# Patient Record
Sex: Male | Born: 1990 | Race: Black or African American | Hispanic: No | Marital: Single | Smoking: Never smoker
Health system: Southern US, Community
[De-identification: ages and names within clinical notes are randomized; demographics above are authoritative.]

## PROBLEM LIST (undated history)

## (undated) DIAGNOSIS — M24419 Recurrent dislocation, unspecified shoulder: Secondary | ICD-10-CM

## (undated) HISTORY — DX: Recurrent dislocation, unspecified shoulder: M24.419

---
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
## 2012-01-21 ENCOUNTER — Ambulatory Visit: Payer: Self-pay | Admitting: Family Medicine

## 2012-02-21 ENCOUNTER — Ambulatory Visit (INDEPENDENT_AMBULATORY_CARE_PROVIDER_SITE_OTHER): Payer: Medicaid Other | Admitting: Family Medicine

## 2012-02-21 ENCOUNTER — Encounter: Payer: Self-pay | Admitting: Family Medicine

## 2012-02-21 VITALS — BP 127/77 | HR 73 | Temp 98.7°F | Ht 68.0 in | Wt 174.0 lb

## 2012-02-21 DIAGNOSIS — M549 Dorsalgia, unspecified: Secondary | ICD-10-CM

## 2012-02-21 DIAGNOSIS — L910 Hypertrophic scar: Secondary | ICD-10-CM

## 2012-02-21 DIAGNOSIS — H61899 Other specified disorders of external ear, unspecified ear: Secondary | ICD-10-CM | POA: Insufficient documentation

## 2012-02-21 DIAGNOSIS — M24419 Recurrent dislocation, unspecified shoulder: Secondary | ICD-10-CM | POA: Insufficient documentation

## 2012-02-21 MED ORDER — CYCLOBENZAPRINE HCL 10 MG PO TABS: 5.0000 mg | ORAL_TABLET | Freq: Two times a day (BID) | ORAL | Status: AC | PRN

## 2012-02-21 NOTE — Patient Instructions (Signed)
Nice to meet you. 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 Do not get any more piercings or tattoos, which can make keloids worse. You can try flexeril or motrin/aleve when you have back pain. Use heating pads as needed. Make appointment if this gets worse or is happening frequently.  Back Pain, Adult Back pain is very common. The pain often gets better over time. The cause of back pain is usually not dangerous. Most people can learn to manage their back pain on their own.  HOME CARE   Stay active. Start with short walks on flat ground if you can. Try to walk farther each day.  Do not sit, drive, or stand in one place for more than 30 minutes. Do not stay in bed.  Do not avoid exercise or work. Activity can help your back heal faster.  Be careful when you bend or lift an object. Bend at your knees, keep the object close to you, and do not twist.  Sleep on a firm mattress. Lie on your side, and bend your knees. If you lie on your back, put a pillow under your knees.  Only take medicines as told by your doctor.  Put ice on the injured area.  Put ice in a plastic bag.  Place a towel between your skin and the bag.  Leave the ice on for 15 to 20 minutes, 3 to 4 times a day for the first 2 to 3 days. After that, you can switch between ice and heat packs.  Ask your doctor about back exercises or massage.  Avoid feeling anxious or stressed. Find good ways to deal with stress, such as exercise. GET HELP RIGHT AWAY IF:   Your pain does not go away with rest or medicine.  Your pain does not go away in 1 week.  You have new problems.  You do not feel well.  The pain spreads into your legs.  You cannot control when you poop (bowel movement) or pee (urinate).  Your arms or legs feel weak or lose feeling (numbness).  You feel sick to your stomach (nauseous) or throw up (vomit).  You have belly (abdominal) pain.  You feel  like you may pass out (faint). MAKE SURE YOU:   Understand these instructions.  Will watch your condition.  Will get help right away if you are not doing well or get worse. Document Released: 07/31/2007 Document Revised: 05/06/2011 Document Reviewed: 07/02/2010 [facility] Patient Information 2013 [place], [place].

## 2012-02-21 NOTE — Assessment & Plan Note (Addendum)
Most likely recurrent lumbosacral strain. No symptoms today. Advised he may use conservative therapy with NSAIDs, heat, prn if this recurs. May try flexeril for spasm prn. Not likely to be a more insidious or hereditary arthritis, but his mother has quite severe hip arthritis and this may be a consideration if it worsens. F/u if symptoms worsen or fails conservative therapy.

## 2012-02-21 NOTE — Assessment & Plan Note (Signed)
Has appearance of most likely keloid vs dermoid cyst. Since this is in a sensitive area and causing some pain, will refer to plastic surgery for further evaluation and treatment. Advised patient avoid further piercing/tattoos and earrings.

## 2012-02-21 NOTE — Progress Notes (Signed)
  Subjective:    Patient ID: Derrick Gray, male    DOB: 19-Nov-1990, 21 y.o.   MRN: [id number]  HPI New patient to establish care.  1. Right ear nodule. Present nearly one year, but notices is enlarging in size for past few weeks to months. There is some pain sometimes and he wants this removed. Had an ear piercing in the same area few years ago, no longer wears the ear ring due to swelling and pain. No previous treatments. No oozing or drainage, fever, rash or other areas of swelling.   2. Low back pains. Happens periodically every few months. He gets stiffness in left lower back lasting few days before resolution. Normally takes tylenol and rests. Cannot identify specific triggers sometimes, but other times it occurs after a fast movement or repetitive lifting.  Has a history of bilateral recurrent shoulder dislocations and s/p surgical repair of the right side.  Denies numbness, falls, weakness, trauma, fever, chills, weight loss, neck pain, urinary problems.  Past Medical History  Diagnosis Date  . Shoulder dislocation, recurrent    Past Surgical History  Procedure Date  . Shoulder surgery 2009    recurrent dislocation   Family History  Problem Relation Age of Onset  . Hypertension Mother   . Arthritis Mother   . Hypertension Maternal Grandmother    History   Social History  . Marital Status: Single    Spouse Name: N/A    Number of Children: N/A  . Years of Education: N/A   Occupational History  . Not on file.   Social History Main Topics  . Smoking status: Never Smoker   . Smokeless tobacco: Not on file  . Alcohol Use: No  . Drug Use: No  . Sexually Active: Not on file   Other Topics Concern  . Not on file   Social History Narrative   Attends college in [place]. His mother is Manufacturing engineer. Single child. No alcohol, tobacco or drug use.    Review of Systems All other ROS negative except as noted above.     Objective:   Physical Exam  Vitals  reviewed. Constitutional: He is oriented to person, place, and time. He appears well-developed and well-nourished. No distress.  HENT:  Head: Normocephalic and atraumatic.  Left Ear: External ear normal.       Right pinna has ~0.5cm nodule surrounding the piercing hole. Slightly tender. No warmth or erythema.   Eyes: EOM are normal. Pupils are equal, round, and reactive to light.  Neck: Normal range of motion. Neck supple. No thyromegaly present.  Cardiovascular: Normal rate, regular rhythm and normal heart sounds.  Exam reveals no gallop.   No murmur heard. Pulmonary/Chest: Effort normal and breath sounds normal. No respiratory distress. He has no wheezes. He has no rales.  Musculoskeletal:       Spinal ROM intact.  No TTP. Slight muscle hypertonicity in right lumbar paraspinal muscles.  No SI joint TTP. Gait normal.   Lymphadenopathy:    He has no cervical adenopathy.  Neurological: He is alert and oriented to person, place, and time.  Skin: No rash noted. He is not diaphoretic.  Psychiatric: He has a normal mood and affect.      Assessment & Plan:

## 2012-03-02 ENCOUNTER — Telehealth: Payer: Self-pay | Admitting: Family Medicine

## 2012-03-02 NOTE — Telephone Encounter (Signed)
Mom is calling checking on the status of the referral to the Plastic Surgeon.  She really wanted to have that taken care of before he starts back to school next week.

## 2012-03-02 NOTE — Telephone Encounter (Signed)
Spoke with patient's mother and informed her that we cannot do the referral until our name is on the Medicaid card. She will  Go on to social services to get this process going and then will call us back when the card has been changed. I am deferring this referral in workqueue.

## 2015-08-26 ENCOUNTER — Ambulatory Visit
Admission: EM | Admit: 2015-08-26 | Discharge: 2015-08-26 | Disposition: A | Discharge status: Home / Self Care | Payer: BC Managed Care – PPO | Attending: Family Medicine | Admitting: Family Medicine

## 2015-08-26 ENCOUNTER — Encounter: Payer: Self-pay | Admitting: Gynecology

## 2015-08-26 DIAGNOSIS — Z202 Contact with and (suspected) exposure to infections with a predominantly sexual mode of transmission: Secondary | ICD-10-CM | POA: Diagnosis not present

## 2015-08-26 LAB — CHLAMYDIA/NGC RT PCR (ARMC ONLY)
Chlamydia Tr: NOT DETECTED
N gonorrhoeae: NOT DETECTED

## 2015-08-26 MED ORDER — AZITHROMYCIN 500 MG PO TABS
1000.0000 mg | ORAL_TABLET | Freq: Every day | ORAL | Status: DC
  Administered 2015-08-26: 1000 mg via ORAL

## 2015-08-26 MED ORDER — CEFTRIAXONE SODIUM 250 MG IJ SOLR
250.0000 mg | Freq: Once | INTRAMUSCULAR | Status: AC
  Administered 2015-08-26: 250 mg via INTRAMUSCULAR

## 2015-08-26 NOTE — ED Provider Notes (Signed)
[facility]  ____________________________________________  Time seen: Approximately 1:21 PM  I have reviewed the triage vital signs and the nursing notes.   HISTORY  Chief Complaint Exposure to STD   HPI Derrick Gray is a 25 y.o. male presents for complaint of STD exposure. Patient reports that he received a text message this morning from one of his partners stating that she was tested positive for chlamydia. Patient states last sexual encounter with this partner was yesterday. Patient reports that he has been sexually active with 2 different partners in the last month. Patient reports he intermittently uses condoms. Patient denies any penile drainage, dysuria, swelling, rash, pain, fevers, abdominal pain, back pain, rectal pain, itching or any other changes. Patient denies any complaints or symptoms except for that he was exposed to chlamydia. Patient reports that he was last tested approximate 2-3 months ago at [facility] and states that he was negative at that time. Denies any history of herpes. Patient requests STD testing.    Past Medical History  Diagnosis Date  . Shoulder dislocation, recurrent     Patient Active Problem List   Diagnosis Date Noted  . Nodule of external ear 02/21/2012  . Back pain 02/21/2012  . Shoulder dislocation, recurrent 02/21/2012  . Keloid 02/21/2012    Past Surgical History  Procedure Laterality Date  . Shoulder surgery  2009    recurrent dislocation    Current Outpatient Rx  Name  Route  Sig  Dispense  Refill  .             Allergies Review of patient's allergies indicates no known allergies.  Family History  Problem Relation Age of Onset  . Hypertension Mother   . Arthritis Mother   . Hypertension Maternal Grandmother     Social History Social History  Substance Use Topics  . Smoking status: Never Smoker   . Smokeless tobacco: None  . Alcohol Use: No    Review of  Systems Constitutional: No fever/chills Eyes: No visual changes. ENT: No sore throat. Cardiovascular: Denies chest pain. Respiratory: Denies shortness of breath. Gastrointestinal: No abdominal pain.  No nausea, no vomiting.  No diarrhea.  No constipation. Genitourinary: Negative for dysuria. Musculoskeletal: Negative for back pain. Skin: Negative for rash. Neurological: Negative for headaches, focal weakness or numbness.  10-point ROS otherwise negative.  ____________________________________________   PHYSICAL EXAM:  VITAL SIGNS: ED Triage Vitals  Enc Vitals Group     BP 08/26/15 1243 128/70 mmHg     Pulse Rate 08/26/15 1243 82     Resp 08/26/15 1243 16     Temp 08/26/15 1243 98.9 F (37.2 C)     Temp Source 08/26/15 1243 Oral     SpO2 08/26/15 1243 100 %     Weight 08/26/15 1243 185 lb (83.915 kg)     Height 08/26/15 1243 5\' 9"  (1.753 m)     Head Cir --      Peak Flow --      Pain Score 08/26/15 1245 0     Pain Loc --      Pain Edu? --      Excl. in GC? --     Constitutional: Alert and oriented. Well appearing and in no acute distress. Eyes: Conjunctivae are normal. PERRL. EOMI. Head: Atraumatic.  Ears:Normal external appearance bilaterally.  Nose: No congestion/rhinnorhea.  Mouth/Throat: Mucous membranes are moist.  Oropharynx non-erythematous. Neck: No stridor.  No cervical spine tenderness to palpation. Hematological/Lymphatic/Immunilogical: No  cervical lymphadenopathy. Cardiovascular: Normal rate, regular rhythm. Grossly normal heart sounds.  Good peripheral circulation. Respiratory: Normal respiratory effort.  No retractions. Lungs CTAB. No wheezes, rales or rhonchi Gastrointestinal: Soft and nontender. No distention. Normal Bowel sounds. No CVA tenderness. Male: Exam completed with Bennetta LaosStephen RN at bedside as chaperone. No penile or testicular rash or lesions. No penile or testicular swelling or tenderness. No penile discharge or drainage. No inguinal mass.  Normal appearance. Musculoskeletal: No lower or upper extremity tenderness nor edema. Ambulatory with steady gait.  Neurologic:  Normal speech and language. No gross focal neurologic deficits are appreciated. No gait instability. Skin:  Skin is warm, dry and intact. No rash noted. Psychiatric: Mood and affect are normal. Speech and behavior are normal.  ____________________________________________   LABS (all labs ordered are listed, but only abnormal results are displayed)  Labs Reviewed  CHLAMYDIA/NGC RT PCR ([facility] ONLY)  RPR  HIV ANTIBODY (ROUTINE TESTING)  HSV(HERPES SIMPLEX VRS) I + II AB-IGG  HSV(HERPES SIMPLEX VRS) I + II AB-IGM   ___ INITIAL IMPRESSION / ASSESSMENT AND PLAN / ED COURSE  Pertinent labs & imaging results that were available during my care of the patient were reviewed by me and considered in my medical decision making (see chart for details).  Well-appearing patient. No acute distress. Presenting for complaint of STD exposure. Patient denies complaints. Patient reports that he found out that one of his male partners tested positive for chlamydia. Patient states he wants to be tested for gonorrhea chlamydia, herpes, syphilis and HIV. Orders placed. Will go ahead and treat patient with oral 1 g azithromycin, 250 mg IM Rocephin. Encouraged patient to inform all partners. Encouraged patient to follow-up in one week with [facility]. Encouraged patient to refrain from sexual activity until followed up and clear.   Discussed follow up with Primary care physician this week. Discussed follow up and return parameters including no resolution or any worsening concerns. Patient verbalized understanding and agreed to plan.   ____________________________________________   FINAL CLINICAL IMPRESSION(S) / ED DIAGNOSES  Final diagnoses:  STD exposure     Discharge Medication List as of 08/26/2015  1:27 PM      Note: This dictation was prepared  with Dragon dictation along with smaller phrase technology. Any transcriptional errors that result from this process are unintentional.       Renford DillsLindsey Hennessy Bartel, NP 08/26/15 1457

## 2015-08-26 NOTE — ED Notes (Signed)
Patient c/o girlfriend test positive for Chlamydia and wanted to be tested.

## 2015-08-26 NOTE — Discharge Instructions (Signed)
Inform all partners. Practice safe sex. No sexual activity until follow up to ensure clearance.   Follow up with your primary care physician this week as needed. Return to Urgent care for new or worsening concerns.     Sexually Transmitted Disease A sexually transmitted disease (STD) is a disease or infection often passed to another person during sex. However, STDs can be passed through nonsexual ways. An STD can be passed through:  Spit (saliva).  Semen.  Blood.  Mucus from the vagina.  Pee (urine). HOW CAN I LESSEN MY CHANCES OF GETTING AN STD?  Use:  Latex condoms.  Water-soluble lubricants with condoms. Do not use petroleum jelly or oils.  Dental dams. These are small pieces of latex that are used as a barrier during oral sex.  Avoid having more than one sex partner.  Do not have sex with someone who has other sex partners.  Do not have sex with anyone you do not know or who is at high risk for an STD.  Avoid risky sex that can break your skin.  Do not have sex if you have open sores on your mouth or skin.  Avoid drinking too much alcohol or taking illegal drugs. Alcohol and drugs can affect your good judgment.  Avoid oral and anal sex acts.  Get shots (vaccines) for HPV and hepatitis.  If you are at risk of being infected with HIV, it is advised that you take a certain medicine daily to prevent HIV infection. This is called pre-exposure prophylaxis (PrEP). You may be at risk if:  You are a man who has sex with other men (MSM).  You are attracted to the opposite sex (heterosexual) and are having sex with more than one partner.  You take drugs with a needle.  You have sex with someone who has HIV.  Talk with your doctor about if you are at high risk of being infected with HIV. If you begin to take PrEP, get tested for HIV first. Get tested every 3 months for as long as you are taking PrEP.  Get tested for STDs every year if you are sexually active. If you  are treated for an STD, get tested again 3 months after you are treated. WHAT SHOULD I DO IF I THINK I HAVE AN STD?  See your doctor.  Tell your sex partner(s) that you have an STD. They should be tested and treated.  Do not have sex until your doctor says it is okay. WHEN SHOULD I GET HELP? Get help right away if:  You have bad belly (abdominal) pain.  You are a man and have puffiness (swelling) or pain in your testicles.  You are a woman and have puffiness in your vagina.   This information is not intended to replace advice given to you by your health care provider. Make sure you discuss any questions you have with your health care provider.   Document Released: 03/21/2004 Document Revised: 03/04/2014 Document Reviewed: 08/07/2012 Elsevier Interactive Patient Education [employer]2016 [employer] Inc.

## 2015-08-30 ENCOUNTER — Telehealth: Payer: Self-pay | Admitting: Emergency Medicine

## 2015-08-30 LAB — HSV(HERPES SIMPLEX VRS) I + II AB-IGG: HSV 1 Glycoprotein G Ab, IgG: <0.91 index (ref 0.00–0.90)

## 2015-08-30 LAB — RPR: RPR Ser Ql: NONREACTIVE

## 2015-08-30 LAB — HIV ANTIBODY (ROUTINE TESTING W REFLEX): HIV Screen 4th Generation wRfx: NONREACTIVE

## 2015-08-30 LAB — HSV(HERPES SIMPLEX VRS) I + II AB-IGM: HSVI/II Comb IgM: 1.18 Ratio — ABNORMAL HIGH (ref 0.00–0.90)

## 2015-08-30 MED ORDER — VALACYCLOVIR HCL 1 G PO TABS: 1000.0000 mg | ORAL_TABLET | Freq: Two times a day (BID) | ORAL | Status: AC

## 2015-08-30 NOTE — Telephone Encounter (Signed)
Rx to pharmacy

## 2015-09-15 ENCOUNTER — Telehealth: Payer: Self-pay | Admitting: *Deleted

## 2015-09-15 NOTE — Telephone Encounter (Signed)
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 Patient had accessed his My Chart account and was unable to find the positive HSV test result. I assisted the patient in finding the result and asked if he had started taking the valtrex prescribed by the provider. Patient stated that he had not purchased the valtrex but would soon to treat his HSV.
